# Patient Record
Sex: Female | Born: 2000 | Race: White | Hispanic: No | Marital: Single | Smoking: Never smoker
Health system: Southern US, Community
[De-identification: ages and names within clinical notes are randomized; demographics above are authoritative.]

## PROBLEM LIST (undated history)

## (undated) HISTORY — PX: ANKLE SURGERY: SHX546

---

## 2019-02-28 ENCOUNTER — Telehealth: Payer: Self-pay | Admitting: Family Medicine

## 2019-04-17 ENCOUNTER — Other Ambulatory Visit: Payer: Self-pay

## 2019-04-17 DIAGNOSIS — Z20822 Contact with and (suspected) exposure to covid-19: Secondary | ICD-10-CM

## 2019-04-18 LAB — NOVEL CORONAVIRUS, NAA: SARS-CoV-2, NAA: NOT DETECTED

## 2019-09-09 ENCOUNTER — Other Ambulatory Visit: Payer: Self-pay | Admitting: Family Medicine

## 2019-09-09 DIAGNOSIS — U071 COVID-19: Secondary | ICD-10-CM

## 2019-09-09 DIAGNOSIS — Z8616 Personal history of COVID-19: Secondary | ICD-10-CM

## 2019-09-09 DIAGNOSIS — I400 Infective myocarditis: Secondary | ICD-10-CM

## 2019-09-16 ENCOUNTER — Ambulatory Visit (INDEPENDENT_AMBULATORY_CARE_PROVIDER_SITE_OTHER): Payer: BC Managed Care – PPO

## 2019-09-16 ENCOUNTER — Other Ambulatory Visit: Payer: Self-pay

## 2019-09-16 ENCOUNTER — Other Ambulatory Visit
Admission: RE | Admit: 2019-09-16 | Discharge: 2019-09-16 | Disposition: A | Discharge status: Home / Self Care | Payer: BC Managed Care – PPO | Source: Ambulatory Visit | Attending: Family Medicine | Admitting: Family Medicine

## 2019-09-16 DIAGNOSIS — Z8616 Personal history of COVID-19: Secondary | ICD-10-CM

## 2019-09-16 DIAGNOSIS — I400 Infective myocarditis: Secondary | ICD-10-CM | POA: Diagnosis not present

## 2019-09-16 DIAGNOSIS — U071 COVID-19: Secondary | ICD-10-CM | POA: Diagnosis not present

## 2019-09-16 LAB — TROPONIN I (HIGH SENSITIVITY): Troponin I (High Sensitivity): 18 ng/L — ABNORMAL HIGH (ref <18)

## 2019-10-27 ENCOUNTER — Other Ambulatory Visit: Payer: Self-pay

## 2019-10-27 ENCOUNTER — Emergency Department
Admission: EM | Admit: 2019-10-27 | Discharge: 2019-10-28 | Disposition: A | Discharge status: Home / Self Care | Payer: BC Managed Care – PPO | Attending: Emergency Medicine | Admitting: Emergency Medicine

## 2019-10-27 DIAGNOSIS — K529 Noninfective gastroenteritis and colitis, unspecified: Secondary | ICD-10-CM

## 2019-10-27 DIAGNOSIS — R1084 Generalized abdominal pain: Secondary | ICD-10-CM | POA: Diagnosis not present

## 2019-10-27 DIAGNOSIS — R5381 Other malaise: Secondary | ICD-10-CM | POA: Diagnosis not present

## 2019-10-27 DIAGNOSIS — E860 Dehydration: Secondary | ICD-10-CM | POA: Diagnosis not present

## 2019-10-27 DIAGNOSIS — Z8616 Personal history of COVID-19: Secondary | ICD-10-CM | POA: Insufficient documentation

## 2019-10-27 DIAGNOSIS — R1110 Vomiting, unspecified: Secondary | ICD-10-CM | POA: Diagnosis present

## 2019-10-27 LAB — COMPREHENSIVE METABOLIC PANEL
ALT: 17 U/L (ref 0–44)
AST: 36 U/L (ref 15–41)
Albumin: 4.3 g/dL (ref 3.5–5.0)
Alkaline Phosphatase: 54 U/L (ref 38–126)
Anion gap: 12 (ref 5–15)
BUN: 16 mg/dL (ref 6–20)
CO2: 24 mmol/L (ref 22–32)
Calcium: 9.3 mg/dL (ref 8.9–10.3)
Chloride: 100 mmol/L (ref 98–111)
Creatinine, Ser: 0.91 mg/dL (ref 0.44–1.00)
GFR calc Af Amer: >60 mL/min (ref >60)
GFR calc non Af Amer: >60 mL/min (ref >60)
Glucose, Bld: 169 mg/dL — ABNORMAL HIGH (ref 70–99)
Potassium: 3.5 mmol/L (ref 3.5–5.1)
Sodium: 136 mmol/L (ref 135–145)
Total Bilirubin: 0.9 mg/dL (ref 0.3–1.2)
Total Protein: 8.2 g/dL — ABNORMAL HIGH (ref 6.5–8.1)

## 2019-10-27 LAB — CBC
HCT: 43 % (ref 36.0–46.0)
Hemoglobin: 14.4 g/dL (ref 12.0–15.0)
MCH: 29.2 pg (ref 26.0–34.0)
MCHC: 33.5 g/dL (ref 30.0–36.0)
MCV: 87.2 fL (ref 80.0–100.0)
Platelets: 261 10*3/uL (ref 150–400)
RBC: 4.93 MIL/uL (ref 3.87–5.11)
RDW: 12.7 % (ref 11.5–15.5)
WBC: 19.3 10*3/uL — ABNORMAL HIGH (ref 4.0–10.5)
nRBC: 0 % (ref 0.0–0.2)

## 2019-10-27 LAB — LIPASE, BLOOD: Lipase: 26 U/L (ref 11–51)

## 2019-10-27 MED ORDER — FAMOTIDINE IN NACL 20-0.9 MG/50ML-% IV SOLN
20.0000 mg | Freq: Once | INTRAVENOUS | Status: AC
  Administered 2019-10-28: 20 mg via INTRAVENOUS
  Filled 2019-10-27: qty 50

## 2019-10-27 MED ORDER — SODIUM CHLORIDE 0.9 % IV BOLUS
1000.0000 mL | Freq: Once | INTRAVENOUS | Status: AC
  Administered 2019-10-28: 1000 mL via INTRAVENOUS

## 2019-10-27 MED ORDER — PROMETHAZINE HCL 25 MG/ML IJ SOLN
12.5000 mg | Freq: Once | INTRAMUSCULAR | Status: AC
  Administered 2019-10-28: 12.5 mg via INTRAVENOUS
  Filled 2019-10-27: qty 1

## 2019-10-27 MED ORDER — ONDANSETRON HCL 4 MG/2ML IJ SOLN
4.0000 mg | Freq: Once | INTRAMUSCULAR | Status: AC
  Administered 2019-10-27: 4 mg via INTRAVENOUS
  Filled 2019-10-27: qty 2

## 2019-10-27 NOTE — ED Triage Notes (Signed)
Patient reports shortness of breath for the past hour.  Reports is started while she was vomiting.

## 2019-10-28 LAB — URINALYSIS, COMPLETE (UACMP) WITH MICROSCOPIC
Bacteria, UA: NONE SEEN
Bilirubin Urine: NEGATIVE
Glucose, UA: NEGATIVE mg/dL
Hgb urine dipstick: NEGATIVE
Ketones, ur: 20 mg/dL — AB
Leukocytes,Ua: NEGATIVE
Nitrite: NEGATIVE
Protein, ur: NEGATIVE mg/dL
Specific Gravity, Urine: 1.026 (ref 1.005–1.030)
pH: 7 (ref 5.0–8.0)

## 2019-10-28 LAB — POCT PREGNANCY, URINE: Preg Test, Ur: NEGATIVE

## 2019-10-28 MED ORDER — ONDANSETRON 4 MG PO TBDP: 4.0000 mg | ORAL_TABLET | Freq: Three times a day (TID) | ORAL | 0 refills | Status: DC | PRN

## 2019-10-28 MED ORDER — FAMOTIDINE 20 MG PO TABS: 20.0000 mg | ORAL_TABLET | Freq: Two times a day (BID) | ORAL | 0 refills | Status: DC

## 2019-10-28 NOTE — ED Notes (Signed)
Pt tolerated ginger ale and saltine crackers well. Denies pain or nausea at this time.

## 2019-10-28 NOTE — ED Provider Notes (Signed)
[facility] Emergency Department Provider Note  ____________________________________________  Time seen: Approximately 2:38 AM  I have reviewed the triage vital signs and the nursing notes.   HISTORY  Chief Complaint Shortness of Breath and Emesis    HPI Kristen Rosales is a 19 y.o. female with no significant past medical history who comes the ED complaining of malaise, body aches, chills  and generalized abdominal discomfort that was present all day today, and then around 8 PM she started having vomiting and watery diarrhea.  Multiple episodes.  Denies fever, chest pain.  During the most intense period of vomiting, she reportedly felt short of breath, but this was only in the context of vomiting.  She denies any shortness of breath currently.  Abdominal discomfort is crampy, nonradiating, generalized, no aggravating or alleviating factors  Reports she had Covid about 2 months ago.     No past medical history on file.   There are no problems to display for this patient.       Prior to Admission medications   Medication Sig Start Date End Date Taking? Authorizing Provider  famotidine (PEPCID) 20 MG tablet Take 1 tablet (20 mg total) by mouth 2 (two) times daily. 10/28/19   Sharman Cheek, MD  ondansetron (ZOFRAN ODT) 4 MG disintegrating tablet Take 1 tablet (4 mg total) by mouth every 8 (eight) hours as needed for nausea or vomiting. 10/28/19   Sharman Cheek, MD     Allergies Patient has no allergy information on record.   No family history on file.  Social History Social History   Tobacco Use  . Smoking status: Not on file  Substance Use Topics  . Alcohol use: Not on file  . Drug use: Not on file    Review of Systems  Constitutional:   No fever positive chills.  ENT:   No sore throat. No rhinorrhea. Cardiovascular:   No chest pain or syncope. Respiratory:   No significant dyspnea or cough. Gastrointestinal:   Positive for abdominal  pain, vomiting and diarrhea.  Musculoskeletal:   Negative for focal pain or swelling All other systems reviewed and are negative except as documented above in ROS and HPI.  ____________________________________________   PHYSICAL EXAM:  VITAL SIGNS: ED Triage Vitals  Enc Vitals Group     BP 10/27/19 2240 (!) 117/100     Pulse Rate 10/27/19 2240 88     Resp 10/27/19 2240 (!) 22     Temp 10/27/19 2240 97.8 F (36.6 C)     Temp Source 10/27/19 2240 Oral     SpO2 10/27/19 2240 100 %     Weight 10/27/19 2238 145 lb (65.8 kg)     Height 10/27/19 2238 5\' 9"  (1.753 m)     Head Circumference --      Peak Flow --      Pain Score 10/27/19 2238 0     Pain Loc --      Pain Edu? --      Excl. in [place]? --     Vital signs reviewed, nursing assessments reviewed.   Constitutional:   Alert and oriented. Non-toxic appearance. Eyes:   Conjunctivae are normal. EOMI. PERRL. ENT      Head:   Normocephalic and atraumatic.      Nose:   Wearing a mask.      Mouth/Throat:   Wearing a mask.      Neck:   No meningismus. Full ROM. Hematological/Lymphatic/Immunilogical:   No cervical lymphadenopathy. Cardiovascular:  RRR. Symmetric bilateral radial and DP pulses.  No murmurs. Cap refill less than 2 seconds. Respiratory:   Normal respiratory effort without tachypnea/retractions. Breath sounds are clear and equal bilaterally. No wheezes/rales/rhonchi. Gastrointestinal:   Soft and nontender. Non distended. There is no CVA tenderness.  No rebound, rigidity, or guarding. Musculoskeletal:   Normal range of motion in all extremities. No joint effusions.  No lower extremity tenderness.  No edema. Neurologic:   Normal speech and language.  Motor grossly intact. No acute focal neurologic deficits are appreciated.  Skin:    Skin is warm, dry and intact. No rash noted.  No petechiae, purpura, or bullae.  ____________________________________________    LABS (pertinent positives/negatives) (all labs ordered  are listed, but only abnormal results are displayed) Labs Reviewed  COMPREHENSIVE METABOLIC PANEL - Abnormal; Notable for the following components:      Result Value   Glucose, Bld 169 (*)    Total Protein 8.2 (*)    All other components within normal limits  CBC - Abnormal; Notable for the following components:   WBC 19.3 (*)    All other components within normal limits  URINALYSIS, COMPLETE (UACMP) WITH MICROSCOPIC - Abnormal; Notable for the following components:   Color, Urine YELLOW (*)    APPearance CLEAR (*)    Ketones, ur 20 (*)    All other components within normal limits  LIPASE, BLOOD  POC URINE PREG, ED  POCT PREGNANCY, URINE   ____________________________________________   EKG    ____________________________________________    RADIOLOGY  No results found.  ____________________________________________   PROCEDURES Procedures  ____________________________________________    CLINICAL IMPRESSION / ASSESSMENT AND PLAN / ED COURSE  Medications ordered in the ED: Medications  ondansetron (ZOFRAN) injection 4 mg (4 mg Intravenous Given 10/27/19 2246)  sodium chloride 0.9 % bolus 1,000 mL (0 mLs Intravenous Stopped 10/28/19 0112)  famotidine (PEPCID) IVPB 20 mg premix (0 mg Intravenous Stopped 10/28/19 0036)  promethazine (PHENERGAN) injection 12.5 mg (12.5 mg Intravenous Given 10/28/19 0006)    Pertinent labs & imaging results that were available during my care of the patient were reviewed by me and considered in my medical decision making (see chart for details).  Kristen Rosales was evaluated in Emergency Department on 10/28/2019 for the symptoms described in the history of present illness. She was evaluated in the context of the global COVID-19 pandemic, which necessitated consideration that the patient might be at risk for infection with the SARS-CoV-2 virus that causes COVID-19. Institutional protocols and algorithms that pertain to the evaluation of patients at  risk for COVID-19 are in a state of rapid change based on information released by regulatory bodies including the CDC and federal and state organizations. These policies and algorithms were followed during the patient's care in the ED.   Patient presents with vomiting and diarrhea.  Vital signs unremarkable.  Exam benign.  Patient is nontoxic.  Labs unremarkable.  Most likely viral gastroenteritis.   Considering the patient's symptoms, medical history, and physical examination today, I have low suspicion for cholecystitis or biliary pathology, pancreatitis, perforation or bowel obstruction, hernia, intra-abdominal abscess, AAA or dissection, volvulus or intussusception, mesenteric ischemia, or appendicitis.  Doubt STI PID TOA torsion ACS PE dissection Boerhaave syndrome.  Patient already received IV Zofran.  We will give IV fluids for hydration, Phenergan, famotidine, p.o. trial.  Clinical Course as of Oct 27 236  Mon Oct 28, 2019  0236 Pt feeling much better, tolerating PO. Stable for Dc.    [  PS]    Clinical Course User Index [PS] Carrie Mew, MD     ____________________________________________   FINAL CLINICAL IMPRESSION(S) / ED DIAGNOSES    Final diagnoses:  Dehydration  Gastroenteritis     ED Discharge Orders         Ordered    famotidine (PEPCID) 20 MG tablet  2 times daily     10/28/19 0237    ondansetron (ZOFRAN ODT) 4 MG disintegrating tablet  Every 8 hours PRN     10/28/19 0237          Portions of this note were generated with dragon dictation software. Dictation errors may occur despite best attempts at proofreading.   Carrie Mew, MD 10/28/19 [phone]

## 2020-03-20 ENCOUNTER — Ambulatory Visit
Admission: RE | Admit: 2020-03-20 | Discharge: 2020-03-20 | Disposition: A | Discharge status: Home / Self Care | Payer: BC Managed Care – PPO | Attending: Sports Medicine | Admitting: Sports Medicine

## 2020-03-20 ENCOUNTER — Other Ambulatory Visit: Payer: Self-pay | Admitting: Sports Medicine

## 2020-03-20 ENCOUNTER — Other Ambulatory Visit: Payer: Self-pay

## 2020-03-20 ENCOUNTER — Ambulatory Visit
Admission: RE | Admit: 2020-03-20 | Discharge: 2020-03-20 | Disposition: A | Discharge status: Home / Self Care | Payer: BC Managed Care – PPO | Source: Ambulatory Visit | Attending: Sports Medicine | Admitting: Sports Medicine

## 2020-03-20 DIAGNOSIS — M79641 Pain in right hand: Secondary | ICD-10-CM | POA: Insufficient documentation

## 2022-03-16 IMAGING — CR DG HAND 2V*R*
1 series · 2 of 2 positions shown · non-contrast
Comparison: None.

CLINICAL DATA: Soccer injury

EXAM:
RIGHT HAND - 2 VIEW

[Series 1: dg hand 2 view right · 0.14mm/px · 2 of 2 slices shown]
[im 1/2]
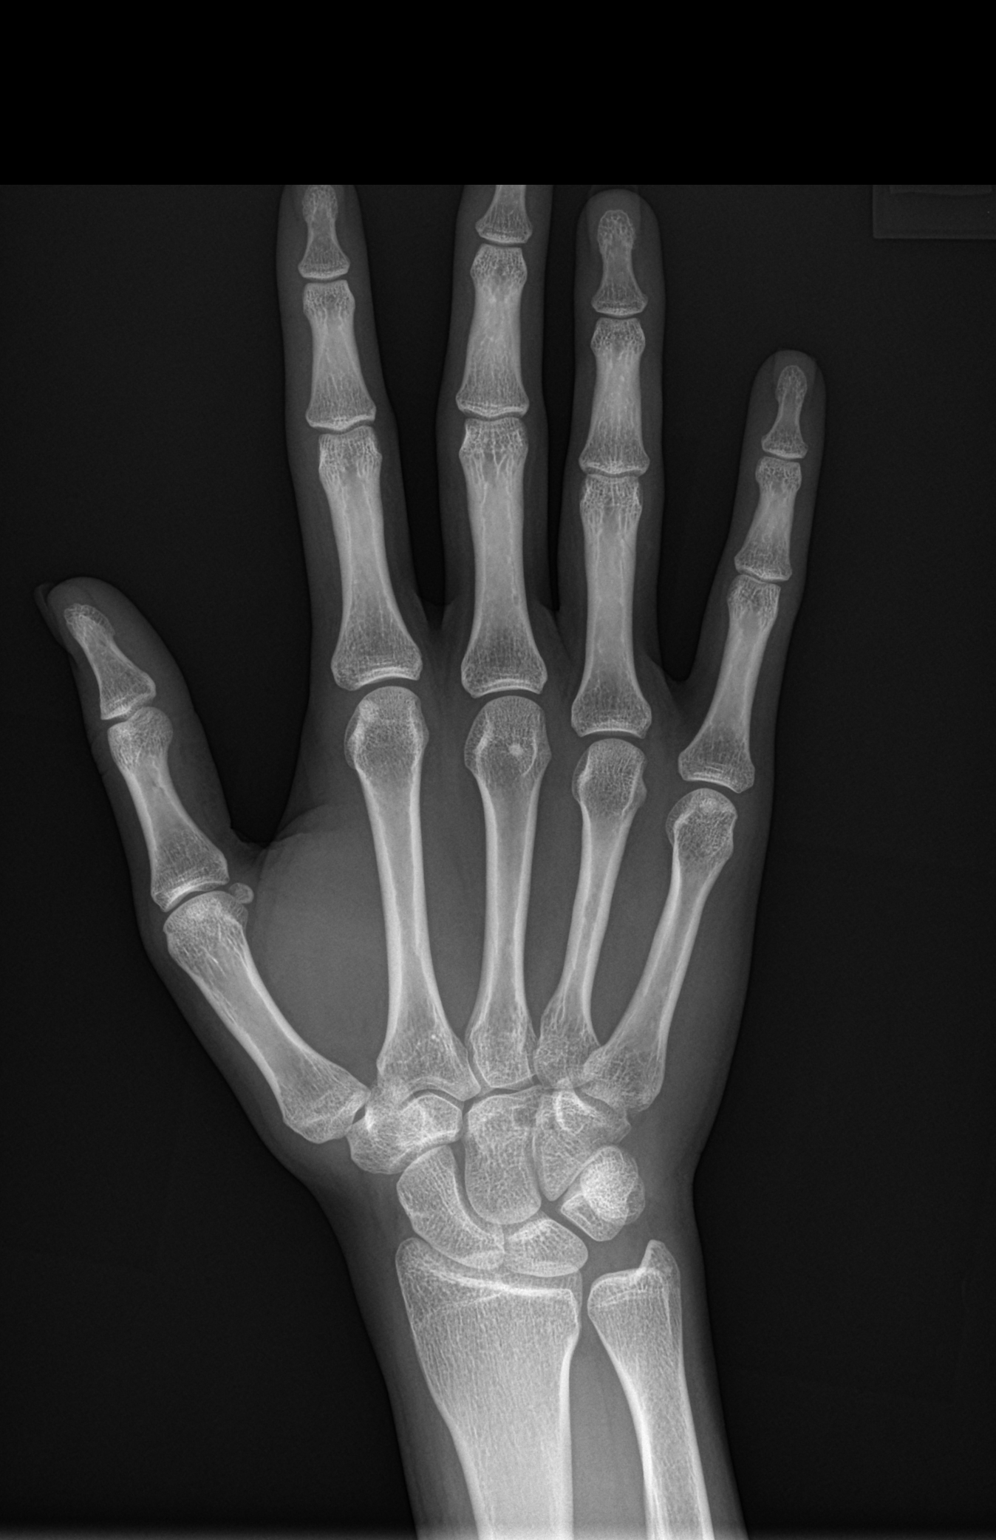
[im 2/2]
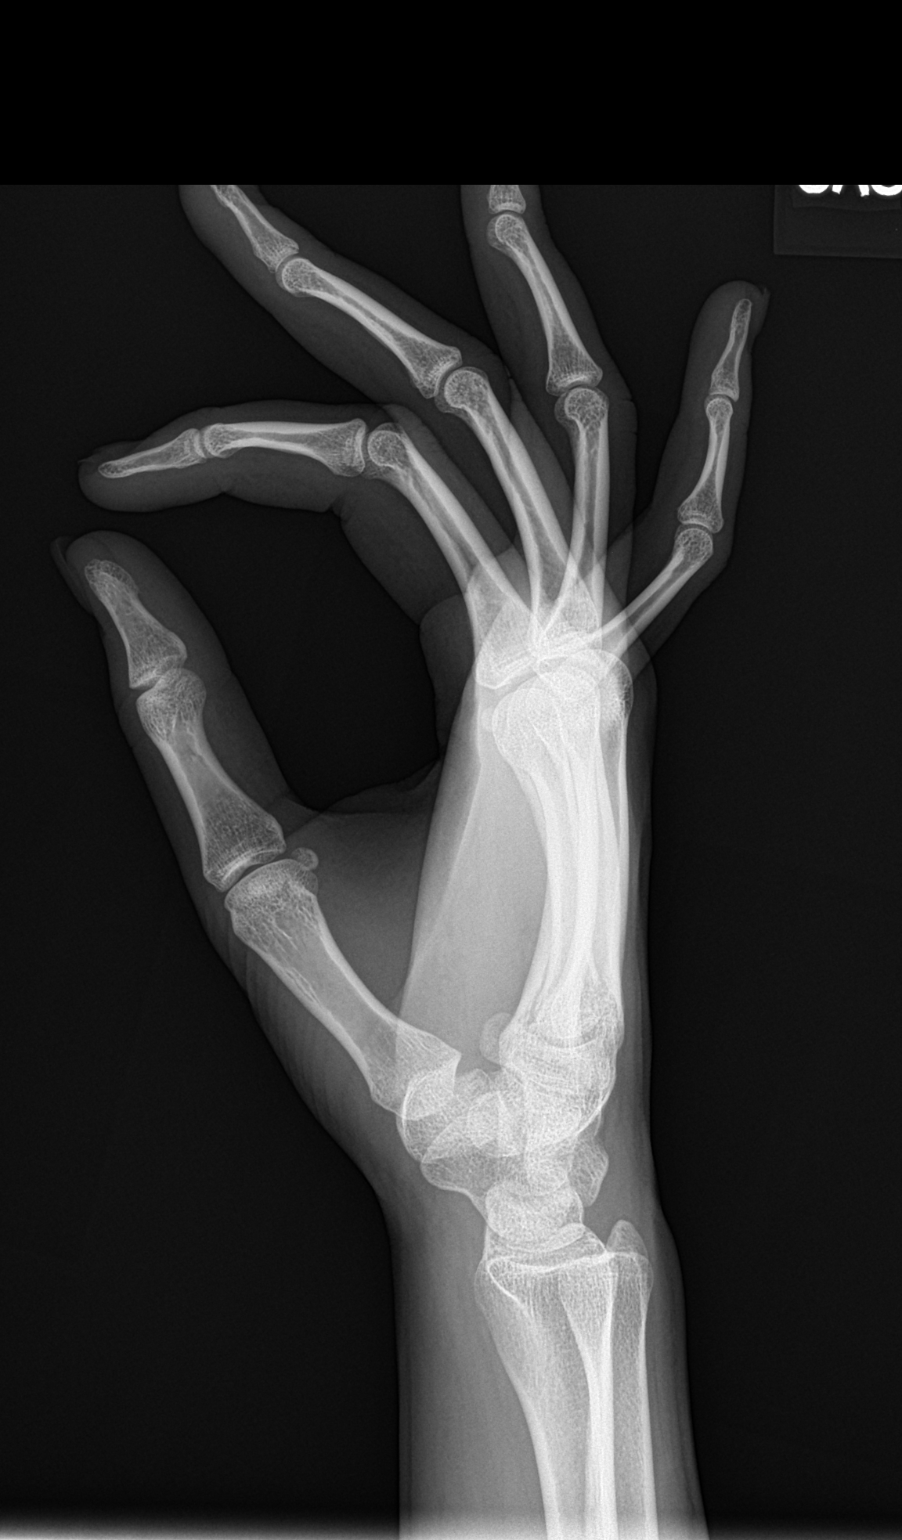

[2 of 2 positions shown; findings below may reference images not displayed]

FINDINGS: There is no evidence of fracture or dislocation. There is no
evidence of arthropathy or other focal bone abnormality. Soft
tissues are unremarkable.
IMPRESSION: Negative.

## 2022-05-14 ENCOUNTER — Ambulatory Visit
Admission: EM | Admit: 2022-05-14 | Discharge: 2022-05-14 | Disposition: A | Discharge status: Home / Self Care | Payer: BC Managed Care – PPO | Attending: Emergency Medicine | Admitting: Emergency Medicine

## 2022-05-14 DIAGNOSIS — B349 Viral infection, unspecified: Secondary | ICD-10-CM

## 2022-05-14 LAB — POCT RAPID STREP A (OFFICE): Rapid Strep A Screen: NEGATIVE

## 2022-05-14 NOTE — Discharge Instructions (Addendum)
Your strep test is negative.    Take Tylenol or ibuprofen as needed for fever or discomfort.  Rest and keep yourself hydrated.    Follow-up with your primary care provider if your symptoms are not improving.     

## 2022-05-14 NOTE — ED Provider Notes (Signed)
Roderic Palau    CSN: [id number] Arrival date & time: 05/14/22  1253      History   Chief Complaint Chief Complaint  Patient presents with   Sore Throat    HPI Kristen Rosales is a 21 y.o. female.  Patient presents with 2-day history of sore throat, headache, cough.  Treatment at home with OTC cough medication this morning.  No fever, rash, shortness of breath, vomiting, diarrhea, or other symptoms.  No pertinent medical history.  The history is provided by the patient and medical records.    History reviewed. No pertinent past medical history.  There are no problems to display for this patient.   History reviewed. No pertinent surgical history.  OB History   No obstetric history on file.      Home Medications    Prior to Admission medications   Medication Sig Start Date End Date Taking? Authorizing Provider  etonogestrel-ethinyl estradiol (NUVARING) 0.12-0.015 MG/24HR vaginal ring Place vaginally. 03/18/22   [provider]  famotidine (PEPCID) 20 MG tablet Take 1 tablet (20 mg total) by mouth 2 (two) times daily. 10/28/19   Carrie Mew, MD  ondansetron (ZOFRAN ODT) 4 MG disintegrating tablet Take 1 tablet (4 mg total) by mouth every 8 (eight) hours as needed for nausea or vomiting. 10/28/19   Carrie Mew, MD    Family History History reviewed. No pertinent family history.  Social History     Allergies   Patient has no known allergies.   Review of Systems Review of Systems  Constitutional:  Positive for chills. Negative for fever.  HENT:  Positive for sore throat. Negative for ear pain.   Respiratory:  Positive for cough. Negative for shortness of breath.   Cardiovascular:  Negative for chest pain and palpitations.  Gastrointestinal:  Negative for diarrhea and vomiting.  Skin:  Negative for rash.  Neurological:  Positive for headaches.  All other systems reviewed and are negative.    Physical Exam Triage Vital Signs ED  Triage Vitals  Enc Vitals Group     BP      Pulse      Resp      Temp      Temp src      SpO2      Weight      Height      Head Circumference      Peak Flow      Pain Score      Pain Loc      Pain Edu?      Excl. in [place]?    No data found.  Updated Vital Signs BP 104/75   Pulse 97   Temp 99.2 F (37.3 C)   Resp 18   Ht 5\' 9"  (1.753 m)   SpO2 95%   BMI 21.41 kg/m   Visual Acuity Right Eye Distance:   Left Eye Distance:   Bilateral Distance:    Right Eye Near:   Left Eye Near:    Bilateral Near:     Physical Exam Vitals and nursing note reviewed.  Constitutional:      General: She is not in acute distress.    Appearance: Normal appearance. She is well-developed. She is not ill-appearing.  HENT:     Right Ear: Tympanic membrane normal.     Left Ear: Tympanic membrane normal.     Nose: Nose normal.     Mouth/Throat:     Mouth: Mucous membranes are moist.     Pharynx:  Oropharynx is clear.     Comments: Clear PND.  Cardiovascular:     Rate and Rhythm: Normal rate and regular rhythm.     Heart sounds: Normal heart sounds.  Pulmonary:     Effort: Pulmonary effort is normal. No respiratory distress.     Breath sounds: Normal breath sounds.  Musculoskeletal:     Cervical back: Neck supple.  Skin:    General: Skin is warm and dry.  Neurological:     Mental Status: She is alert.  Psychiatric:        Mood and Affect: Mood normal.        Behavior: Behavior normal.      UC Treatments / Results  Labs (all labs ordered are listed, but only abnormal results are displayed) Labs Reviewed  POCT RAPID STREP A (OFFICE)    EKG   Radiology No results found.  Procedures Procedures (including critical care time)  Medications Ordered in UC Medications - No data to display  Initial Impression / Assessment and Plan / UC Course  I have reviewed the triage vital signs and the nursing notes.  Pertinent labs & imaging results that were available during my care  of the patient were reviewed by me and considered in my medical decision making (see chart for details).   Viral illness.  Rapid strep negative.  Patient declines COVID test.  Discussed symptomatic treatment including Tylenol or ibuprofen, rest, hydration.  Instructed patient to follow up with her PCP if symptoms are not improving.  She agrees to plan of care.    Final Clinical Impressions(s) / UC Diagnoses   Final diagnoses:  Viral illness     Discharge Instructions      Your strep test is negative.    Take Tylenol or ibuprofen as needed for fever or discomfort.  Rest and keep yourself hydrated.    Follow-up with your primary care provider if your symptoms are not improving.         ED Prescriptions   None    PDMP not reviewed this encounter.   Mickie Bail, NP 05/14/22 1320

## 2022-05-14 NOTE — ED Triage Notes (Signed)
Patient to Urgent Care with complaints of  sore throat x2 days. Also reports chills, headache, and cough.

## 2022-12-05 ENCOUNTER — Other Ambulatory Visit: Payer: Self-pay

## 2022-12-05 ENCOUNTER — Encounter: Payer: Self-pay | Admitting: Medical

## 2022-12-05 ENCOUNTER — Ambulatory Visit (INDEPENDENT_AMBULATORY_CARE_PROVIDER_SITE_OTHER): Payer: PRIVATE HEALTH INSURANCE | Admitting: Medical

## 2022-12-05 VITALS — BP 108/72 | HR 92 | Temp 97.0°F | Wt 151.0 lb

## 2022-12-05 DIAGNOSIS — J019 Acute sinusitis, unspecified: Secondary | ICD-10-CM

## 2022-12-05 MED ORDER — AMOXICILLIN-POT CLAVULANATE 875-125 MG PO TABS: 1.0000 | ORAL_TABLET | Freq: Two times a day (BID) | ORAL | 0 refills | Status: AC

## 2022-12-05 NOTE — Progress Notes (Signed)
[facility] [address] [place], [place] [address] Phone: [phone] Fax: [phone]   Office Visit Note  Patient Name: Kristen Rosales  Date of Birth:[birth year]  Med Rec number [id number]  Date of Service: 12/05/2022  Allergies: Patient has no known allergies.  Chief Complaint  Patient presents with   Sinusitis     HPI 22 y.o. college student presents with possible sinus infection.  Developed cold symptoms about 5 days ago including nasal congestion, cough and sore throat. Possible low fever 3 days ago, was brief, had chills and sweats. Noted pressure to forehead and some  pain to right cheek/upper jaw yesterday. Some HA to right temple last couple days. Some nasal d/c in last day or so, yellow tinted. Cough has been productive of clear mucus. Some sweats at night last few days. Sore throat has improved. Having difficult time smelling.   Had negative at home COVID-19 antigen test yesterday. No known exposures/sick contacts.  No hx of sinus infections or seasonal allergies.  Taking Sudafed (Pseudoephedrine) and Ibuprofen. Took Sudafed this AM. Also some Nyquil. Meds some helpful.   First final exam in 4 days. Patient is graduating senior.   Current Medication:  Outpatient Encounter Medications as of 12/05/2022  Medication Sig   etonogestrel-ethinyl estradiol (NUVARING) 0.12-0.015 MG/24HR vaginal ring Place vaginally.   [DISCONTINUED] famotidine (PEPCID) 20 MG tablet Take 1 tablet (20 mg total) by mouth 2 (two) times daily.   [DISCONTINUED] ondansetron (ZOFRAN ODT) 4 MG disintegrating tablet Take 1 tablet (4 mg total) by mouth every 8 (eight) hours as needed for nausea or vomiting.   No facility-administered encounter medications on file as of 12/05/2022.      Medical History: History reviewed. No pertinent past medical history.   Vital Signs: BP 108/72   Pulse 92   Temp (!) 97 F (36.1 C) (Tympanic)   Wt 151 lb (68.5 kg)   SpO2 99%   BMI 22.30 kg/m     Review of Systems See HPI   Physical Exam Vitals reviewed.  Constitutional:      General: She is not in acute distress.    Comments: Tired appearing  HENT:     Head: Normocephalic.     Right Ear: Tympanic membrane, ear canal and external ear normal.     Left Ear: Tympanic membrane, ear canal and external ear normal.     Nose: Mucosal edema and congestion (mild) present. No rhinorrhea.     Right Turbinates: Swollen.     Left Turbinates: Swollen.     Right Sinus: Maxillary sinus tenderness (slight, more pressure than pain) present.     Left Sinus: Maxillary sinus tenderness (slight, more pressure than pain) present.     Comments: Nasal mucosa mildly erythematous.    Mouth/Throat:     Mouth: Mucous membranes are moist. No oral lesions.     Pharynx: No pharyngeal swelling or posterior oropharyngeal erythema.     Tonsils: No tonsillar exudate. 0 on the right. 0 on the left.  Cardiovascular:     Rate and Rhythm: Normal rate and regular rhythm.     Heart sounds: No murmur heard.    No friction rub. No gallop.  Pulmonary:     Effort: Pulmonary effort is normal.     Breath sounds: Normal breath sounds. No wheezing, rhonchi or rales.  Musculoskeletal:     Cervical back: Neck supple. No rigidity.  Lymphadenopathy:     Cervical: Cervical adenopathy (1+ anterior nodes, mildly tender) present.  Neurological:  Mental Status: She is alert.     Assessment/Plan: 1. Acute non-recurrent sinusitis, unspecified location Discussed that most sinus infections caused by viruses rather than bacteria. Will check CBC with differential to better characterize infection. Recommended neti pot/nasal saline rinses and Flonase in addition to Ibuprofen/Sudafed.  - CBC with Differential/Platelet  Patient Instructions:  -Rest and stay well hydrated (by drinking water and other liquids).  -Take over-the-counter medicines (i.e. Sudafed, Ibuprofen) to help relieve your symptoms. -Use a neti pot or  saline nasal rinse twice daily. -Add Flonase/Fluticasone nasal spray, 2 sprays to each nostril once a day. -For your sore throat/cough, use cough drops/throat lozenges, gargle warm salt water and/or drink warm liquids (like tea with honey).  -You will receive a MyChart message notifying you of your lab results when they are available.  -Send MyChart message to provider or schedule return visit in meantime as needed for new/worsening symptoms (i.e. fever, increased sinus pain).  Preferred pharmacy: [facility], [address] in [place] Counseling: [name] verbalizes understanding of the findings of todays visit and agrees with plan of treatment. she has been encouraged to call the office with any questions or concerns that should arise related to todays visit.   Orders Placed This Encounter  Procedures   CBC with Differential/Platelet    Time spent:25 Minutes    Jonathon Resides PA-C [facility] 12/05/2022 9:29 AM

## 2022-12-05 NOTE — Patient Instructions (Signed)
-  Rest and stay well hydrated (by drinking water and other liquids).  -Take over-the-counter medicines (i.e. Sudafed, Ibuprofen) to help relieve your symptoms. -Use a neti pot or saline nasal rinse twice daily. -Add Flonase/Fluticasone nasal spray, 2 sprays to each nostril once a day. -For your sore throat/cough, use cough drops/throat lozenges, gargle warm salt water and/or drink warm liquids (like tea with honey).  -You will receive a MyChart message notifying you of your lab results when they are available.  -Send MyChart message to provider or schedule return visit in meantime as needed for new/worsening symptoms (i.e. fever, increased sinus pain).

## 2022-12-06 LAB — CBC WITH DIFFERENTIAL/PLATELET
Basophils Absolute: 0 10*3/uL (ref 0.0–0.2)
Basos: 0 %
EOS (ABSOLUTE): 0.1 10*3/uL (ref 0.0–0.4)
Eos: 1 %
Hematocrit: 38.3 % (ref 34.0–46.6)
Hemoglobin: 12.3 g/dL (ref 11.1–15.9)
Immature Grans (Abs): 0 10*3/uL (ref 0.0–0.1)
Immature Granulocytes: 0 %
Lymphocytes Absolute: 1.7 10*3/uL (ref 0.7–3.1)
Lymphs: 16 %
MCH: 27.6 pg (ref 26.6–33.0)
MCHC: 32.1 g/dL (ref 31.5–35.7)
MCV: 86 fL (ref 79–97)
Monocytes Absolute: 0.6 10*3/uL (ref 0.1–0.9)
Monocytes: 6 %
Neutrophils Absolute: 8.1 10*3/uL — ABNORMAL HIGH (ref 1.4–7.0)
Neutrophils: 77 %
Platelets: 213 10*3/uL (ref 150–450)
RBC: 4.45 x10E6/uL (ref 3.77–5.28)
RDW: 12 % (ref 11.7–15.4)
WBC: 10.6 10*3/uL (ref 3.4–10.8)
